# Patient Record
Sex: Female | Born: 1955 | Race: White | Hispanic: No | Marital: Married | Smoking: Never smoker
Health system: Southern US, Community
[De-identification: ages and names within clinical notes are randomized; demographics above are authoritative.]

## PROBLEM LIST (undated history)

## (undated) DIAGNOSIS — E785 Hyperlipidemia, unspecified: Secondary | ICD-10-CM

## (undated) DIAGNOSIS — I10 Essential (primary) hypertension: Secondary | ICD-10-CM

## (undated) HISTORY — DX: Essential (primary) hypertension: I10

## (undated) HISTORY — DX: Hyperlipidemia, unspecified: E78.5

---

## 2012-01-20 ENCOUNTER — Ambulatory Visit (INDEPENDENT_AMBULATORY_CARE_PROVIDER_SITE_OTHER): Payer: Self-pay | Admitting: Physician Assistant

## 2012-01-20 ENCOUNTER — Encounter: Payer: Self-pay | Admitting: Physician Assistant

## 2012-01-20 VITALS — BP 150/94 | HR 78 | Resp 14 | Ht 67.5 in | Wt 238.0 lb

## 2012-01-20 DIAGNOSIS — Z1211 Encounter for screening for malignant neoplasm of colon: Secondary | ICD-10-CM

## 2012-01-20 DIAGNOSIS — L409 Psoriasis, unspecified: Secondary | ICD-10-CM

## 2012-01-20 DIAGNOSIS — E785 Hyperlipidemia, unspecified: Secondary | ICD-10-CM

## 2012-01-20 DIAGNOSIS — I10 Essential (primary) hypertension: Secondary | ICD-10-CM | POA: Insufficient documentation

## 2012-01-20 DIAGNOSIS — Z1239 Encounter for other screening for malignant neoplasm of breast: Secondary | ICD-10-CM

## 2012-01-20 DIAGNOSIS — L408 Other psoriasis: Secondary | ICD-10-CM

## 2012-01-20 MED ORDER — METOPROLOL SUCCINATE ER 100 MG PO TB24: 100.0000 mg | ORAL_TABLET | Freq: Every day | ORAL | Status: DC

## 2012-01-20 MED ORDER — SIMVASTATIN 20 MG PO TABS: 20.0000 mg | ORAL_TABLET | Freq: Every evening | ORAL | Status: DC

## 2012-01-20 NOTE — Patient Instructions (Addendum)
Dr. Jearld Lesch for OBGYN.   Diet and exercise watch BP. Need BP under 140/90.  Will refer to colonoscopy and mammogram.

## 2012-01-20 NOTE — Progress Notes (Signed)
  Subjective:    Patient ID: Priscilla Sanders, female    DOB: 07-Aug-1955, 56 y.o.   MRN: [id number]  HPI Patient presents to the clinic to establish care and get med refills. PMH is positive for HTN and Hyperlipidemia for the past 5 years or more. Pt takes Toprol and zocor to manage this. She does not smoke. She tries to exercise but has not exercised much over the past 3 months because she has been busy. She admits to gaining about 30 pounds over the past 2-3 years. She does eat healthy.  She has no complaints or concerns today.   She does have ongoing psoriasis. She is trying to find out what a doctor prescribed 5 years a go that worked so well for her psoriasis.   Patient's BP was elevated today. Denies CP, palpitations, numbness and tingling, SOB or Headaches. She does not remember her BP being elevated in the past but does admit to high salt diet and weight gain.   She had a CPE with blood work this year.    Review of Systems     Objective:   Physical Exam  Constitutional: She is oriented to person, place, and time. She appears well-developed and well-nourished.       Overweight.  HENT:  Head: Normocephalic and atraumatic.  Cardiovascular: Normal rate, regular rhythm and normal heart sounds.   No murmur heard. Pulmonary/Chest: Effort normal and breath sounds normal.  Neurological: She is alert and oriented to person, place, and time.  Skin: Skin is warm and dry.  Psychiatric: She has a normal mood and affect. Her behavior is normal.          Assessment & Plan:  HTN- Toprol refilled. Discussed need to get BP under 140/90. Pt does not want to add another medication at this point. She wants to try diet, exercise and low salt. Want pt to monitor BP and call office if not decreasing with goal 120/80 and at least under 140/90. Follow up in 3 months and if BP not controlled then will need to add another agent. Discussed effects of HTN.   Hyperlipidemia- Refilled Zocor. Will recheck lipid  when I find out last check was.   Psoriasis Will wait and see if patient can find out the combination cream. If pt can call in and will rx.   Will refer for colonscopy and mammogram.  For OBGYN referred to Dr. Jearld Lesch. She requested a OB/GYN for pap smear needs.

## 2012-05-29 ENCOUNTER — Encounter: Payer: Self-pay | Admitting: Family Medicine

## 2012-05-29 ENCOUNTER — Ambulatory Visit (INDEPENDENT_AMBULATORY_CARE_PROVIDER_SITE_OTHER): Payer: BC Managed Care – PPO | Admitting: Family Medicine

## 2012-05-29 VITALS — BP 162/100 | HR 66 | Temp 97.8°F | Wt 234.0 lb

## 2012-05-29 DIAGNOSIS — I10 Essential (primary) hypertension: Secondary | ICD-10-CM

## 2012-05-29 DIAGNOSIS — J329 Chronic sinusitis, unspecified: Secondary | ICD-10-CM

## 2012-05-29 DIAGNOSIS — B9689 Other specified bacterial agents as the cause of diseases classified elsewhere: Secondary | ICD-10-CM

## 2012-05-29 DIAGNOSIS — L409 Psoriasis, unspecified: Secondary | ICD-10-CM

## 2012-05-29 DIAGNOSIS — A499 Bacterial infection, unspecified: Secondary | ICD-10-CM

## 2012-05-29 DIAGNOSIS — L408 Other psoriasis: Secondary | ICD-10-CM

## 2012-05-29 DIAGNOSIS — J45909 Unspecified asthma, uncomplicated: Secondary | ICD-10-CM

## 2012-05-29 MED ORDER — ALBUTEROL SULFATE HFA 108 (90 BASE) MCG/ACT IN AERS: 2.0000 | INHALATION_SPRAY | Freq: Four times a day (QID) | RESPIRATORY_TRACT | Status: DC | PRN

## 2012-05-29 MED ORDER — MOMETASONE FUROATE 50 MCG/ACT NA SUSP: NASAL | Status: DC

## 2012-05-29 MED ORDER — CLOBETASOL PROPIONATE 0.05 % EX OINT: TOPICAL_OINTMENT | Freq: Two times a day (BID) | CUTANEOUS | Status: DC

## 2012-05-29 MED ORDER — METOPROLOL SUCCINATE ER 200 MG PO TB24: 200.0000 mg | ORAL_TABLET | Freq: Every day | ORAL | Status: DC

## 2012-05-29 MED ORDER — AMOXICILLIN-POT CLAVULANATE 500-125 MG PO TABS: ORAL_TABLET | ORAL | Status: AC

## 2012-05-29 MED ORDER — BETAMETHASONE DIPROPIONATE 0.05 % EX CREA: TOPICAL_CREAM | Freq: Two times a day (BID) | CUTANEOUS | Status: DC

## 2012-05-29 NOTE — Progress Notes (Signed)
CC: Priscilla Sanders is a 57 y.o. female is here for Sore Throat and Shortness of Breath   Subjective: HPI:  Patient complains of one week of worsening nasal congestion with facial pressure below the eyes. Has been associated with a cough that is worse when lying down flat. Nasal discharge is worse when leaning forward and produces green thick discharge. Symptoms overall are moderate in severity. Seem to be getting worse on a daily basis but somewhat unchanged today compared to yesterday.  Interventions have included nasal saline washes. Complains of subjective fevers and chills for the past few days. Denies motor or sensory disturbances, headaches, bloody nose, nor dizziness. She has had some wheezing and cough since this all started.  Patient porches run out of topical steroids for psoriasis. She points out plaques on her elbows are worsening in size.  Reports taking metoprolol once a day. No recent missed doses. No outside blood pressures to report. Denies chest pain, headaches, orthopnea, peripheral edema, nor irregular heartbeat.     Review Of Systems Outlined In HPI  Past Medical History  Diagnosis Date  . Hyperlipidemia   . Hypertension      Family History  Problem Relation Age of Onset  . Hyperlipidemia Mother   . Hypertension Mother   . Cancer Mother     breast  . Hyperlipidemia Father   . Hypertension Father   . Cancer Brother      History  Substance Use Topics  . Smoking status: Never Smoker   . Smokeless tobacco: Not on file  . Alcohol Use: 0.0 oz/week     Objective: Filed Vitals:   05/29/12 1017  BP: 162/100  Pulse: 66  Temp: 97.8 F (36.6 C)    General: Alert and Oriented, No Acute Distress HEENT: Pupils equal, round, reactive to light. Conjunctivae clear.  External ears unremarkable, canals clear with intact TMs with appropriate landmarks.  Middle ear appears open without effusion. Pink inferior turbinates with moderate mucoid discharge.  Moist mucous  membranes, pharynx without inflammation nor lesions.  Neck supple without palpable lymphadenopathy nor abnormal masses. Lungs: Clear to auscultation bilaterally, no wheezing/ronchi/rales.  Comfortable work of breathing. Good air movement. Cardiac: Regular rate and rhythm. Normal S1/S2.  No murmurs, rubs, nor gallops.   Extremities: No peripheral edema.  Strong peripheral pulses.  Mental Status: No depression, anxiety, nor agitation. Skin: Warm and dry. Psoriatic plaques on the bilateral elbows approximately 7-8 cm in diameter  Assessment & Plan: Priscilla Sanders was seen today for sore throat and shortness of breath.  Diagnoses and associated orders for this visit:  Hypertension - metoprolol succinate (TOPROL-XL) 200 MG 24 hr tablet; Take 1 tablet (200 mg total) by mouth daily. Take with or immediately following a meal.  Psoriasis - clobetasol ointment (TEMOVATE) 0.05 %; Apply topically 2 (two) times daily. - betamethasone dipropionate (DIPROLENE) 0.05 % cream; Apply topically 2 (two) times daily.  Bacterial sinusitis - amoxicillin-clavulanate (AUGMENTIN) 500-125 MG per tablet; Take one by mouth every 8 hours for ten total days. - mometasone (NASONEX) 50 MCG/ACT nasal spray; Two sprays each nostril daily.  Reactive airway disease - Discontinue: albuterol (PROVENTIL HFA;VENTOLIN HFA) 108 (90 BASE) MCG/ACT inhaler; Inhale 2 puffs into the lungs every 6 (six) hours as needed for wheezing. - albuterol (PROVENTIL HFA;VENTOLIN HFA) 108 (90 BASE) MCG/ACT inhaler; Inhale 2 puffs into the lungs every 6 (six) hours as needed for wheezing.    Hypertension: Uncontrolled, increased to maximum dose metoprolol and return in 1-2 weeks for blood pressure  visit. Psoriasis: Uncontrolled and likely due to running out of medication therefore she was provided with refills Bacterial sinusitis with reactive airways disease probably exacerbated by postnasal drip: Start Augmentin and Nasonex for Nasonex to be taken to the  remainder of pollen season, refill of albuterol provided  Return in about 2 weeks (around 06/12/2012).

## 2012-07-30 ENCOUNTER — Other Ambulatory Visit: Payer: Self-pay | Admitting: Family Medicine

## 2012-07-30 ENCOUNTER — Other Ambulatory Visit: Payer: Self-pay | Admitting: Physician Assistant

## 2012-09-05 ENCOUNTER — Encounter: Payer: Self-pay | Admitting: Physician Assistant

## 2012-09-05 ENCOUNTER — Ambulatory Visit (INDEPENDENT_AMBULATORY_CARE_PROVIDER_SITE_OTHER): Payer: BC Managed Care – PPO | Admitting: Obstetrics & Gynecology

## 2012-09-05 ENCOUNTER — Encounter: Payer: Self-pay | Admitting: Obstetrics & Gynecology

## 2012-09-05 ENCOUNTER — Ambulatory Visit (INDEPENDENT_AMBULATORY_CARE_PROVIDER_SITE_OTHER): Payer: BC Managed Care – PPO | Admitting: Physician Assistant

## 2012-09-05 VITALS — BP 150/89 | HR 58 | Resp 16 | Ht 67.5 in | Wt 231.0 lb

## 2012-09-05 VITALS — BP 154/96 | HR 63 | Temp 98.2°F | Wt 232.0 lb

## 2012-09-05 DIAGNOSIS — N950 Postmenopausal bleeding: Secondary | ICD-10-CM | POA: Insufficient documentation

## 2012-09-05 DIAGNOSIS — Z1151 Encounter for screening for human papillomavirus (HPV): Secondary | ICD-10-CM

## 2012-09-05 DIAGNOSIS — Z01419 Encounter for gynecological examination (general) (routine) without abnormal findings: Secondary | ICD-10-CM

## 2012-09-05 DIAGNOSIS — I498 Other specified cardiac arrhythmias: Secondary | ICD-10-CM

## 2012-09-05 DIAGNOSIS — E669 Obesity, unspecified: Secondary | ICD-10-CM

## 2012-09-05 DIAGNOSIS — Z0000 Encounter for general adult medical examination without abnormal findings: Secondary | ICD-10-CM

## 2012-09-05 DIAGNOSIS — R5383 Other fatigue: Secondary | ICD-10-CM

## 2012-09-05 DIAGNOSIS — Z79899 Other long term (current) drug therapy: Secondary | ICD-10-CM

## 2012-09-05 DIAGNOSIS — I10 Essential (primary) hypertension: Secondary | ICD-10-CM

## 2012-09-05 DIAGNOSIS — R4586 Emotional lability: Secondary | ICD-10-CM

## 2012-09-05 DIAGNOSIS — Z124 Encounter for screening for malignant neoplasm of cervix: Secondary | ICD-10-CM

## 2012-09-05 DIAGNOSIS — R001 Bradycardia, unspecified: Secondary | ICD-10-CM

## 2012-09-05 DIAGNOSIS — F39 Unspecified mood [affective] disorder: Secondary | ICD-10-CM

## 2012-09-05 LAB — FOLLICLE STIMULATING HORMONE: FSH: 56.3 m[IU]/mL

## 2012-09-05 LAB — TSH: TSH: 2.525 u[IU]/mL (ref 0.350–4.500)

## 2012-09-05 MED ORDER — LISINOPRIL-HYDROCHLOROTHIAZIDE 20-12.5 MG PO TABS: 1.0000 | ORAL_TABLET | Freq: Every day | ORAL | Status: DC

## 2012-09-05 MED ORDER — METOPROLOL SUCCINATE ER 100 MG PO TB24: 100.0000 mg | ORAL_TABLET | Freq: Every day | ORAL | Status: DC

## 2012-09-05 MED ORDER — MISOPROSTOL 200 MCG PO TABS: ORAL_TABLET | ORAL | Status: DC

## 2012-09-05 NOTE — Patient Instructions (Signed)
Start Lisinopril/HCTZ daily.  Decrease Toprol to 100mg  daily.  Follow up in 1 week.

## 2012-09-05 NOTE — Progress Notes (Signed)
  Subjective:    Patient ID: Priscilla Sanders, female    DOB: 03/01/1955, 57 y.o.   MRN: [id number]  HPI Patient is a 57 year old female who presents to the clinic with low pulse and high BP. Patient was just seen today in gynecologist office. She has experienced some postmenopausal bleeding and they have scheduled a biopsy. FSH and TSH levels were checked today by gynecologist office. They were alerted because her pulse was in the low 50s at OBs office. Patient admits to have feeling bad for the last 2 weeks. She has experience some feelings of like a ongoing low-grade fever. She has been very sensitive to emotions. She has been slightly bleeding for the last 3 weeks. She denies any fever, shortness of breath, sinus pressure, ear pain, wheezing or any other feelings of illness. She is concerned because she is supposed to go to [employer] in one week with her grandson. She denies any chest pains, palpitations, vision changes, headaches. Her Toprol was increased by Dr. Ivan Anchors to 200 mg at last visit. She admits to never take anything but Toprol for blood pressure problem.   Review of Systems     Objective:   Physical Exam  Constitutional: She is oriented to person, place, and time. She appears well-developed and well-nourished.  HENT:  Head: Normocephalic and atraumatic.  Right Ear: External ear normal.  Left Ear: External ear normal.  Nose: Nose normal.  Mouth/Throat: Oropharynx is clear and moist.  Eyes: Conjunctivae are normal. Right eye exhibits no discharge. Left eye exhibits no discharge.  Neck: Normal range of motion. Neck supple.  Cardiovascular: Regular rhythm and normal heart sounds.   Bradycardia when I checked at 60.  Pulmonary/Chest: Effort normal and breath sounds normal. She has no wheezes.  Lymphadenopathy:    She has no cervical adenopathy.  Neurological: She is alert and oriented to person, place, and time.  Skin: Skin is warm and dry.  Psychiatric: She has a normal mood and  affect. Her behavior is normal.          Assessment & Plan:  Hypertension/bradycardia-discuss with patient that increase of Toprol may cause her pulse to drop too low. Will decrease Toprol back down to 100 mg daily. Also discussed that did not seem to help with blood pressure anyway. Will add lisinopril/HCTZ 2 blood pressure regimen in hopes of decreasing blood pressure and increasing pulse. Side effects of cough and insertion to kidney were discussed. Do not have a recent CMP added to labs done by Dr. Gala Romney this morning. Will recheck in 1 week before patient goes on trips.  Mood changes/hot flashes/fatigue- reassured patient that beta blocker could have been causing her to feel more fatigued and weak. It should not be affecting her mood or hot flashes. She did not have a temperature today. There could be some hormonal changes going on from postmenopausal bleeding. I like to give this time and wants we get results back from biopsy and hormone levels we can make a better call as to why patients on this weight.

## 2012-09-05 NOTE — Progress Notes (Signed)
Subjective:    Priscilla Sanders is a 57 y.o. female who presents for an annual exam. She needs an annual exam and also complains of spotting for about 3-4 weeks after having been menopausal for about a year. The patient is not currently sexually active (husband s/p prostate cancer and has ED)  GYN screening history: last pap: was normal about 2009. The patient wears seatbelts: yes. The patient participates in regular exercise: [place] and works outside. Has the patient ever been transfused or tattooed?: no. The patient reports that there is not domestic violence in her life.   Menstrual History: OB History   Grav Para Term Preterm Abortions TAB SAB Ect Mult Living                  Menarche age: 82 [place]: 64   No LMP recorded. Patient is postmenopausal.    The following portions of the patient's history were reviewed and updated as appropriate: allergies, current medications, past family history, past medical history, past social history, past surgical history and problem list.  Review of Systems A comprehensive review of systems was negative. 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 Married for 30 years, mammogram last done in 2009 with pap, last fasting labs utd   Objective:    BP 150/89  Pulse 58  Resp 16  Ht 5' 7.5" (1.715 m)  Wt 231 lb (104.781 kg)  BMI 35.62 kg/m2  General Appearance:    Alert, cooperative, no distress, appears stated age  Head:    Normocephalic, without obvious abnormality, atraumatic  Eyes:    PERRL, conjunctiva/corneas clear, EOM's intact, fundi    benign, both eyes  Ears:    Normal TM's and external ear canals, both ears  Nose:   Nares normal, septum midline, mucosa normal, no drainage    or sinus tenderness  Throat:   Lips, mucosa, and tongue normal; teeth and gums normal  Neck:   Supple, symmetrical, trachea midline, no adenopathy;    thyroid:  no enlargement/tenderness/nodules; no carotid   bruit or JVD  Back:     Symmetric, no curvature, ROM  normal, no CVA tenderness  Lungs:     Clear to auscultation bilaterally, respirations unlabored  Chest Wall:    No tenderness or deformity   Heart:    Regular rate and rhythm, S1 and S2 normal, no murmur, rub   or gallop  Breast Exam:    No tenderness, masses, or nipple abnormality  Abdomen:     Soft, non-tender, bowel sounds active all four quadrants,    no masses, no organomegaly  Genitalia:    Vulva with a discrete parallel redness pattern (She denies GSUI), cervix with small amount of red blood from os, NSSA, NT, normal adnexal exam     Extremities:   Extremities normal, atraumatic, no cyanosis or edema  Pulses:   2+ and symmetric all extremities  Skin:   Skin color, texture, turgor normal, no rashes or lesions  Lymph nodes:   Cervical, supraclavicular, and axillary nodes normal  Neurologic:   CNII-XII intact, normal strength, sensation and reflexes    throughout  .    Assessment:    Healthy female exam.  Vulvar abnormality PMB   Plan:     Thin prep Pap smear. with cotesting Schedule mammogram Vulvar biopsy and EMBX at next visit. Pretreat with po cytotec. Gyn u/s, TSH (pulse 50 but also on a betablocker for HTN), FSH

## 2012-09-06 ENCOUNTER — Ambulatory Visit (INDEPENDENT_AMBULATORY_CARE_PROVIDER_SITE_OTHER): Payer: BC Managed Care – PPO

## 2012-09-06 ENCOUNTER — Telehealth: Payer: Self-pay | Admitting: *Deleted

## 2012-09-06 DIAGNOSIS — Z1231 Encounter for screening mammogram for malignant neoplasm of breast: Secondary | ICD-10-CM

## 2012-09-06 DIAGNOSIS — N950 Postmenopausal bleeding: Secondary | ICD-10-CM

## 2012-09-06 DIAGNOSIS — R9389 Abnormal findings on diagnostic imaging of other specified body structures: Secondary | ICD-10-CM

## 2012-09-06 DIAGNOSIS — Z0000 Encounter for general adult medical examination without abnormal findings: Secondary | ICD-10-CM

## 2012-09-06 LAB — COMPLETE METABOLIC PANEL WITH GFR
ALT: 22 U/L (ref 0–35)
AST: 18 U/L (ref 0–37)
Alkaline Phosphatase: 78 U/L (ref 39–117)
Creat: 0.96 mg/dL (ref 0.50–1.10)
Sodium: 141 mEq/L (ref 135–145)
Total Bilirubin: 0.6 mg/dL (ref 0.3–1.2)
Total Protein: 7 g/dL (ref 6.0–8.3)

## 2012-09-06 NOTE — Telephone Encounter (Signed)
Copy of labs mailed to pt's home address. 

## 2012-09-19 ENCOUNTER — Ambulatory Visit (INDEPENDENT_AMBULATORY_CARE_PROVIDER_SITE_OTHER): Payer: BC Managed Care – PPO | Admitting: Obstetrics & Gynecology

## 2012-09-19 ENCOUNTER — Encounter: Payer: Self-pay | Admitting: Obstetrics & Gynecology

## 2012-09-19 VITALS — BP 142/98 | HR 74 | Resp 16 | Ht 67.0 in | Wt 230.0 lb

## 2012-09-19 DIAGNOSIS — N909 Noninflammatory disorder of vulva and perineum, unspecified: Secondary | ICD-10-CM

## 2012-09-19 DIAGNOSIS — N904 Leukoplakia of vulva: Secondary | ICD-10-CM

## 2012-09-19 DIAGNOSIS — N950 Postmenopausal bleeding: Secondary | ICD-10-CM

## 2012-09-19 DIAGNOSIS — Z01812 Encounter for preprocedural laboratory examination: Secondary | ICD-10-CM

## 2012-09-19 NOTE — Progress Notes (Addendum)
  Subjective:    Patient ID: Priscilla Sanders, female    DOB: 1955/08/30, 57 y.o.   MRN: [id number]  HPI  She is here today for her vulvar biopsy and EMBX (pmb). She took cytotec last night and has had some cramping and diarrhea.  Review of Systems Mammogram, pap smear, and TSH wnl     Objective:   Physical Exam   UPT negative, consent signed, time out done Cervix prepped with betadine and grasped with a single tooth tenaculum Uterus sounded to 7 cm Pipelle used for 2 passes with a small amount of tissue obtained. She tolerated the procedure well.  Her vulva was prepped on the right labium majus with betadine and infiltrated with 1 cc of 1% lidocaine. I used a 3 mm punch biopsy to remove a small amount of the reddened vulvar skin. Hemostasis was achieved with silver nitrate. She tolerated the procedure well.     Assessment & Plan:  PMB- await EMBX Vulvar redness- ?relation to her psoriasis. Await biopsy. If her PMB continues after 1 month, then she will call and I will schedule a hysteroscopy and dilation and curettage.

## 2012-09-24 ENCOUNTER — Telehealth: Payer: Self-pay | Admitting: *Deleted

## 2012-09-25 NOTE — Telephone Encounter (Signed)
Pt notified of recent biopsies were neg.

## 2012-10-28 ENCOUNTER — Other Ambulatory Visit: Payer: Self-pay | Admitting: Physician Assistant

## 2012-11-02 ENCOUNTER — Other Ambulatory Visit: Payer: Self-pay | Admitting: Physician Assistant

## 2012-12-31 ENCOUNTER — Other Ambulatory Visit: Payer: Self-pay | Admitting: Physician Assistant

## 2013-03-02 ENCOUNTER — Other Ambulatory Visit: Payer: Self-pay | Admitting: Physician Assistant

## 2013-03-04 ENCOUNTER — Ambulatory Visit (INDEPENDENT_AMBULATORY_CARE_PROVIDER_SITE_OTHER): Payer: BC Managed Care – PPO | Admitting: Physician Assistant

## 2013-03-04 ENCOUNTER — Encounter: Payer: Self-pay | Admitting: Physician Assistant

## 2013-03-04 VITALS — BP 134/78 | HR 59 | Wt 227.0 lb

## 2013-03-04 DIAGNOSIS — R82998 Other abnormal findings in urine: Secondary | ICD-10-CM

## 2013-03-04 DIAGNOSIS — R197 Diarrhea, unspecified: Secondary | ICD-10-CM

## 2013-03-04 DIAGNOSIS — R109 Unspecified abdominal pain: Secondary | ICD-10-CM

## 2013-03-04 LAB — POCT URINALYSIS DIPSTICK
Bilirubin, UA: NEGATIVE
Blood, UA: NEGATIVE
Glucose, UA: NEGATIVE
KETONES UA: NEGATIVE
Nitrite, UA: NEGATIVE
Protein, UA: NEGATIVE
SPEC GRAV UA: 1.025
Urobilinogen, UA: 0.2
pH, UA: 5.5

## 2013-03-04 MED ORDER — DICYCLOMINE HCL 10 MG PO CAPS: 10.0000 mg | ORAL_CAPSULE | Freq: Three times a day (TID) | ORAL | Status: DC

## 2013-03-04 NOTE — Progress Notes (Signed)
   Subjective:    Patient ID: Priscilla Sanders, female    DOB: 10/14/1955, 58 y.o.   MRN: [id number]  HPI Patient presents to the clinic with ongoing diarrhea and recent bilateral flank pain. Diarrhea started 6 days ago. She had a few chills and felt horrible. She continues to have 5-10 stools a day that are yellow in color. She does not have loose stools as long as she does not eat. Once she eats she goes to the bathroom constantly for about 3 hours and then gets better. She feels clamy sometimes. Denies any nausea, vomiting. No pain with urination. Denies any dark stools or bright red blood. No new medications. No raw food. No travel. Last Abx was zpak at christmas. No one else is sick. Bilateral flank pain started this morning with 8/10 pain that brought her to tears she is better now. But very concerning.       Review of Systems     Objective:   Physical Exam  Constitutional: She is oriented to person, place, and time. She appears well-developed and well-nourished.  HENT:  Head: Normocephalic and atraumatic.  Cardiovascular: Normal rate, regular rhythm and normal heart sounds.   Pulmonary/Chest: Effort normal and breath sounds normal.  No cva tenderness.   Abdominal: Soft.  Hyperactive bowel sounds.  Slight discomfort in all 4 quadrants to palpation. No guarding, rebound or masses. Negative murphys sign.   Neurological: She is oriented to person, place, and time.  Skin: Skin is dry.  Psychiatric: She has a normal mood and affect. Her behavior is normal.          Assessment & Plan:  1. Bilateral flank pain Unclear etiology but I suspect having spams with diarrhea. Will get culture. For cramping gave bentyl up to 4 times a day.  - POCT urinalysis dipstick - Urine Culture  2. Diarrhea Awaiting results. I do not want to start anti-diarrhea until confirm no bacteria. Gave copy of brat diet and discussed viral infections can last 8-10 days on average. Stay hydrated and push fluids.  Gave signs of dehydration.  - Stool Culture - Stool C-Diff Toxin Assay  3. Leukocytes in urine No symptoms of UTI and no blood in urine to suggest kidney stone. Will get culture and continue to monitor. Likely normal variant.  - Urine Culture

## 2013-03-04 NOTE — Patient Instructions (Signed)
Viral Gastroenteritis  Viral gastroenteritis is also known as stomach flu. This condition affects the stomach and intestinal tract. It can cause sudden diarrhea and vomiting. The illness typically lasts 3 to 8 days. Most people develop an immune response that eventually gets rid of the virus. While this natural response develops, the virus can make you quite ill.  CAUSES   Many different viruses can cause gastroenteritis, such as rotavirus or noroviruses. You can catch one of these viruses by consuming contaminated food or water. You may also catch a virus by sharing utensils or other personal items with an infected person or by touching a contaminated surface.  SYMPTOMS   The most common symptoms are diarrhea and vomiting. These problems can cause a severe loss of body fluids (dehydration) and a body salt (electrolyte) imbalance. Other symptoms may include:  · Fever.  · Headache.  · Fatigue.  · Abdominal pain.  DIAGNOSIS   Your caregiver can usually diagnose viral gastroenteritis based on your symptoms and a physical exam. A stool sample may also be taken to test for the presence of viruses or other infections.  TREATMENT   This illness typically goes away on its own. Treatments are aimed at rehydration. The most serious cases of viral gastroenteritis involve vomiting so severely that you are not able to keep fluids down. In these cases, fluids must be given through an intravenous line (IV).  HOME CARE INSTRUCTIONS   · Drink enough fluids to keep your urine clear or pale yellow. Drink small amounts of fluids frequently and increase the amounts as tolerated.  · Ask your caregiver for specific rehydration instructions.  · Avoid:  · Foods high in sugar.  · Alcohol.  · Carbonated drinks.  · Tobacco.  · Juice.  · Caffeine drinks.  · Extremely hot or cold fluids.  · Fatty, greasy foods.  · Too much intake of anything at one time.  · Dairy products until 24 to 48 hours after diarrhea stops.  · You may consume probiotics.  Probiotics are active cultures of beneficial bacteria. They may lessen the amount and number of diarrheal stools in adults. Probiotics can be found in yogurt with active cultures and in supplements.  · Wash your hands well to avoid spreading the virus.  · Only take over-the-counter or prescription medicines for pain, discomfort, or fever as directed by your caregiver. Do not give aspirin to children. Antidiarrheal medicines are not recommended.  · Ask your caregiver if you should continue to take your regular prescribed and over-the-counter medicines.  · Keep all follow-up appointments as directed by your caregiver.  SEEK IMMEDIATE MEDICAL CARE IF:   · You are unable to keep fluids down.  · You do not urinate at least once every 6 to 8 hours.  · You develop shortness of breath.  · You notice blood in your stool or vomit. This may look like coffee grounds.  · You have abdominal pain that increases or is concentrated in one small area (localized).  · You have persistent vomiting or diarrhea.  · You have a fever.  · The patient is a child younger than 3 months, and he or she has a fever.  · The patient is a child older than 3 months, and he or she has a fever and persistent symptoms.  · The patient is a child older than 3 months, and he or she has a fever and symptoms suddenly get worse.  · The patient is a baby, and he   or she has no tears when crying.  MAKE SURE YOU:   · Understand these instructions.  · Will watch your condition.  · Will get help right away if you are not doing well or get worse.  Document Released: 01/17/2005 Document Revised: 04/11/2011 Document Reviewed: 11/03/2010  ExitCare® Patient Information ©2014 [employer].  Diet for Diarrhea, Adult  Frequent, runny stools (diarrhea) may be caused or worsened by food or drink. Diarrhea may be relieved by changing your diet. Since diarrhea can last up to 7 days, it is easy for you to lose too much fluid from the body and become dehydrated. Fluids that are  lost need to be replaced. Along with a modified diet, make sure you drink enough fluids to keep your urine clear or pale yellow.  DIET INSTRUCTIONS  · Ensure adequate fluid intake (hydration): have 1 cup (8 oz) of fluid for each diarrhea episode. Avoid fluids that contain simple sugars or sports drinks, fruit juices, whole milk products, and sodas. Your urine should be clear or pale yellow if you are drinking enough fluids. Hydrate with an oral rehydration solution that you can purchase at pharmacies, retail stores, and online. You can prepare an oral rehydration solution at home by mixing the following ingredients together:  ·   tsp table salt.  · ¾ tsp baking soda.  ·  tsp salt substitute containing potassium chloride.  · 1  tablespoons sugar.  · 1 L (34 oz) of water.  · Certain foods and beverages may increase the speed at which food moves through the gastrointestinal (GI) tract. These foods and beverages should be avoided and include:  · Caffeinated and alcoholic beverages.  · High-fiber foods, such as raw fruits and vegetables, nuts, seeds, and whole grain breads and cereals.  · Foods and beverages sweetened with sugar alcohols, such as xylitol, sorbitol, and mannitol.  · Some foods may be well tolerated and may help thicken stool including:  · Starchy foods, such as rice, toast, pasta, low-sugar cereal, oatmeal, grits, baked potatoes, crackers, and bagels.    · Bananas.    · Applesauce.  · Add probiotic-rich foods to help increase healthy bacteria in the GI tract, such as yogurt and fermented milk products.  RECOMMENDED FOODS AND BEVERAGES  Starches  Choose foods with less than 2 g of fiber per serving.  · Recommended:  White, French, and pita breads, plain rolls, buns, bagels. Plain muffins, matzo. Soda, saltine, or graham crackers. Pretzels, melba toast, zwieback. Cooked cereals made with water: cornmeal, farina, cream cereals. Dry cereals: refined corn, wheat, rice. Potatoes prepared any way without skins,  refined macaroni, spaghetti, noodles, refined rice.  · Avoid:  Bread, rolls, or crackers made with whole wheat, multi-grains, rye, bran seeds, nuts, or coconut. Corn tortillas or taco shells. Cereals containing whole grains, multi-grains, bran, coconut, nuts, raisins. Cooked or dry oatmeal. Coarse wheat cereals, granola. Cereals advertised as "high-fiber." Potato skins. Whole grain pasta, wild or brown rice. Popcorn. Sweet potatoes, yams. Sweet rolls, doughnuts, waffles, pancakes, sweet breads.  Vegetables  · Recommended: Strained tomato and vegetable juices. Most well-cooked and canned vegetables without seeds. Fresh: Tender lettuce, cucumber without the skin, cabbage, spinach, bean sprouts.  · Avoid: Fresh, cooked, or canned: Artichokes, baked beans, beet greens, broccoli, Brussels sprouts, corn, kale, legumes, peas, sweet potatoes. Cooked: Green or red cabbage, spinach. Avoid large servings of any vegetables because vegetables shrink when cooked, and they contain more fiber per serving than fresh vegetables.  Fruit  · Recommended: Cooked   or canned: Apricots, applesauce, cantaloupe, cherries, fruit cocktail, grapefruit, grapes, kiwi, mandarin oranges, peaches, pears, plums, watermelon. Fresh: Apples without skin, ripe banana, grapes, cantaloupe, cherries, grapefruit, peaches, oranges, plums. Keep servings limited to ½ cup or 1 piece.  · Avoid: Fresh: Apples with skin, apricots, mangoes, pears, raspberries, strawberries. Prune juice, stewed or dried prunes. Dried fruits, raisins, dates. Large servings of all fresh fruits.  Protein  · Recommended: Ground or well-cooked tender beef, ham, veal, lamb, pork, or poultry. Eggs. Fish, oysters, shrimp, lobster, other seafoods. Liver, organ meats.  · Avoid: Tough, fibrous meats with gristle. Peanut butter, smooth or chunky. Cheese, nuts, seeds, legumes, dried peas, beans, lentils.  Dairy  · Recommended: Yogurt, lactose-free milk, kefir, drinkable yogurt, buttermilk, soy  milk, or plain hard cheese.  · Avoid: Milk, chocolate milk, beverages made with milk, such as milkshakes.  Soups  · Recommended: Bouillon, broth, or soups made from allowed foods. Any strained soup.  · Avoid: Soups made from vegetables that are not allowed, cream or milk-based soups.  Desserts and Sweets  · Recommended: Sugar-free gelatin, sugar-free frozen ice pops made without sugar alcohol.  · Avoid: Plain cakes and cookies, pie made with fruit, pudding, custard, cream pie. Gelatin, fruit, ice, sherbet, frozen ice pops. Ice cream, ice milk without nuts. Plain hard candy, honey, jelly, molasses, syrup, sugar, chocolate syrup, gumdrops, marshmallows.  Fats and Oils  · Recommended: Limit fats to less than 8 tsp per day.  · Avoid: Seeds, nuts, olives, avocados. Margarine, butter, cream, mayonnaise, salad oils, plain salad dressings. Plain gravy, crisp bacon without rind.  Beverages  · Recommended: Water, decaffeinated teas, oral rehydration solutions, sugar-free beverages not sweetened with sugar alcohols.  · Avoid: Fruit juices, caffeinated beverages (coffee, tea, soda), alcohol, sports drinks, or lemon-lime soda.  Condiments  · Recommended: Ketchup, mustard, horseradish, vinegar, cocoa powder. Spices in moderation: allspice, basil, bay leaves, celery powder or leaves, cinnamon, cumin powder, curry powder, ginger, mace, marjoram, onion or garlic powder, oregano, paprika, parsley flakes, ground pepper, rosemary, sage, savory, tarragon, thyme, turmeric.  · Avoid: Coconut, honey.  Document Released: 04/09/2003 Document Revised: 10/12/2011 Document Reviewed: 06/03/2011  ExitCare® Patient Information ©2014 [employer].

## 2013-03-05 ENCOUNTER — Other Ambulatory Visit: Payer: Self-pay | Admitting: Physician Assistant

## 2013-03-05 LAB — URINE CULTURE
Colony Count: NO GROWTH
ORGANISM ID, BACTERIA: NO GROWTH

## 2013-03-05 LAB — CLOSTRIDIUM DIFFICILE EIA: CDIFTX: NEGATIVE

## 2013-03-08 LAB — STOOL CULTURE

## 2013-05-04 ENCOUNTER — Other Ambulatory Visit: Payer: Self-pay | Admitting: Physician Assistant

## 2013-06-07 ENCOUNTER — Other Ambulatory Visit: Payer: Self-pay | Admitting: Physician Assistant

## 2013-06-29 ENCOUNTER — Other Ambulatory Visit: Payer: Self-pay | Admitting: Physician Assistant

## 2013-09-01 ENCOUNTER — Other Ambulatory Visit: Payer: Self-pay | Admitting: Physician Assistant

## 2013-10-13 ENCOUNTER — Other Ambulatory Visit: Payer: Self-pay | Admitting: Physician Assistant

## 2013-11-04 ENCOUNTER — Encounter: Payer: Self-pay | Admitting: Physician Assistant

## 2013-11-04 ENCOUNTER — Ambulatory Visit (INDEPENDENT_AMBULATORY_CARE_PROVIDER_SITE_OTHER): Payer: BC Managed Care – PPO | Admitting: Physician Assistant

## 2013-11-04 VITALS — BP 156/84 | HR 84 | Wt 238.0 lb

## 2013-11-04 DIAGNOSIS — R1115 Cyclical vomiting syndrome unrelated to migraine: Secondary | ICD-10-CM

## 2013-11-04 DIAGNOSIS — R1011 Right upper quadrant pain: Secondary | ICD-10-CM | POA: Diagnosis not present

## 2013-11-04 DIAGNOSIS — R142 Eructation: Secondary | ICD-10-CM | POA: Diagnosis not present

## 2013-11-04 DIAGNOSIS — G43A0 Cyclical vomiting, not intractable: Secondary | ICD-10-CM | POA: Diagnosis not present

## 2013-11-04 DIAGNOSIS — I10 Essential (primary) hypertension: Secondary | ICD-10-CM

## 2013-11-04 NOTE — Progress Notes (Signed)
   Subjective:    Patient ID: Priscilla Sanders, female    DOB: 01/15/1956, 58 y.o.   MRN: [id number]  HPI Pt presents to the clinic with RUQ abdominal pain that radiates into her right shoulder and sometimes into her back for last 3 weeks off and on. No known trigger. Eating does not seem to make better or worse. Denies any acid reflux. Saturday night she had an awful episode where she also vomited multiple times and had a pounding headache. She has noticed that she is more burpy. Better today. Nothing that she seems to do makes better.    HTN- not taking medication as directed. Denies any CP, palpations.    Review of Systems  All other systems reviewed and are negative.      Objective:   Physical Exam  Constitutional: She is oriented to person, place, and time. She appears well-developed and well-nourished.  HENT:  Head: Normocephalic and atraumatic.  Cardiovascular: Normal rate, regular rhythm and normal heart sounds.   Pulmonary/Chest: Effort normal and breath sounds normal. She has no wheezes.  Abdominal: Soft. Bowel sounds are normal. She exhibits no distension and no mass. There is no tenderness. There is no rebound and no guarding.  Neurological: She is alert and oriented to person, place, and time.  Skin: Skin is dry.  Psychiatric: She has a normal mood and affect. Her behavior is normal.          Assessment & Plan:  RUQ pain/nausea/vomiting- will get cbc, lipase, cmp. Will get RUQ ultrasound first thing in the morning. If normal will consider HIDA scan.    HTN- discussed need to start back on lisinopril/HCTZ and metoprolol. Refilled for 6 months.

## 2013-11-05 ENCOUNTER — Encounter: Payer: Self-pay | Admitting: Physician Assistant

## 2013-11-05 ENCOUNTER — Ambulatory Visit (INDEPENDENT_AMBULATORY_CARE_PROVIDER_SITE_OTHER): Payer: BC Managed Care – PPO

## 2013-11-05 DIAGNOSIS — R1011 Right upper quadrant pain: Secondary | ICD-10-CM

## 2013-11-05 DIAGNOSIS — K760 Fatty (change of) liver, not elsewhere classified: Secondary | ICD-10-CM

## 2013-11-05 DIAGNOSIS — R1115 Cyclical vomiting syndrome unrelated to migraine: Secondary | ICD-10-CM

## 2013-11-05 DIAGNOSIS — R142 Eructation: Secondary | ICD-10-CM

## 2013-11-05 DIAGNOSIS — R7989 Other specified abnormal findings of blood chemistry: Secondary | ICD-10-CM | POA: Insufficient documentation

## 2013-11-05 LAB — CBC WITH DIFFERENTIAL/PLATELET
BASOS ABS: 0 10*3/uL (ref 0.0–0.1)
BASOS PCT: 0 % (ref 0–1)
EOS PCT: 2 % (ref 0–5)
Eosinophils Absolute: 0.2 10*3/uL (ref 0.0–0.7)
HCT: 40.9 % (ref 36.0–46.0)
Hemoglobin: 13.7 g/dL (ref 12.0–15.0)
Lymphocytes Relative: 47 % — ABNORMAL HIGH (ref 12–46)
Lymphs Abs: 3.6 10*3/uL (ref 0.7–4.0)
MCH: 30 pg (ref 26.0–34.0)
MCHC: 33.5 g/dL (ref 30.0–36.0)
MCV: 89.5 fL (ref 78.0–100.0)
MONO ABS: 0.5 10*3/uL (ref 0.1–1.0)
Monocytes Relative: 7 % (ref 3–12)
NEUTROS ABS: 3.3 10*3/uL (ref 1.7–7.7)
Neutrophils Relative %: 44 % (ref 43–77)
PLATELETS: 195 10*3/uL (ref 150–400)
RBC: 4.57 MIL/uL (ref 3.87–5.11)
RDW: 14.3 % (ref 11.5–15.5)
WBC: 7.6 10*3/uL (ref 4.0–10.5)

## 2013-11-05 LAB — COMPLETE METABOLIC PANEL WITH GFR
ALT: 40 U/L — ABNORMAL HIGH (ref 0–35)
AST: 28 U/L (ref 0–37)
Albumin: 4.2 g/dL (ref 3.5–5.2)
Alkaline Phosphatase: 68 U/L (ref 39–117)
BUN: 18 mg/dL (ref 6–23)
CALCIUM: 9.2 mg/dL (ref 8.4–10.5)
CHLORIDE: 106 meq/L (ref 96–112)
CO2: 25 meq/L (ref 19–32)
CREATININE: 1.16 mg/dL — AB (ref 0.50–1.10)
GFR, EST AFRICAN AMERICAN: 61 mL/min
GFR, Est Non African American: 53 mL/min — ABNORMAL LOW
Glucose, Bld: 89 mg/dL (ref 70–99)
Potassium: 4.2 mEq/L (ref 3.5–5.3)
Sodium: 142 mEq/L (ref 135–145)
Total Bilirubin: 0.4 mg/dL (ref 0.2–1.2)
Total Protein: 6.6 g/dL (ref 6.0–8.3)

## 2013-11-05 LAB — LIPASE: LIPASE: 18 U/L (ref 0–75)

## 2013-11-05 MED ORDER — METOPROLOL SUCCINATE ER 100 MG PO TB24: ORAL_TABLET | ORAL | Status: DC

## 2013-11-05 MED ORDER — LISINOPRIL-HYDROCHLOROTHIAZIDE 20-12.5 MG PO TABS: ORAL_TABLET | ORAL | Status: DC

## 2013-11-11 ENCOUNTER — Other Ambulatory Visit: Payer: Self-pay | Admitting: Physician Assistant

## 2013-12-14 ENCOUNTER — Other Ambulatory Visit: Payer: Self-pay | Admitting: Physician Assistant

## 2014-02-28 ENCOUNTER — Other Ambulatory Visit: Payer: Self-pay | Admitting: Family Medicine

## 2014-02-28 NOTE — Telephone Encounter (Signed)
Appointment needed before future refills 

## 2014-05-20 ENCOUNTER — Other Ambulatory Visit: Payer: Self-pay | Admitting: Physician Assistant

## 2014-05-20 NOTE — Telephone Encounter (Signed)
Left message with Pt advising it is time to schedule f/u appt. Callback information provided.

## 2014-08-09 ENCOUNTER — Other Ambulatory Visit: Payer: Self-pay | Admitting: Physician Assistant

## 2014-08-13 ENCOUNTER — Ambulatory Visit (INDEPENDENT_AMBULATORY_CARE_PROVIDER_SITE_OTHER): Payer: BLUE CROSS/BLUE SHIELD | Admitting: Family Medicine

## 2014-08-13 VITALS — BP 147/93 | HR 75 | Wt 241.0 lb

## 2014-08-13 DIAGNOSIS — I10 Essential (primary) hypertension: Secondary | ICD-10-CM

## 2014-08-13 DIAGNOSIS — Z7189 Other specified counseling: Secondary | ICD-10-CM

## 2014-08-13 DIAGNOSIS — R05 Cough: Secondary | ICD-10-CM

## 2014-08-13 DIAGNOSIS — R059 Cough, unspecified: Secondary | ICD-10-CM | POA: Insufficient documentation

## 2014-08-13 DIAGNOSIS — Z7184 Encounter for health counseling related to travel: Secondary | ICD-10-CM | POA: Insufficient documentation

## 2014-08-13 DIAGNOSIS — Z23 Encounter for immunization: Secondary | ICD-10-CM

## 2014-08-13 MED ORDER — ATOVAQUONE-PROGUANIL HCL 250-100 MG PO TABS: 1.0000 | ORAL_TABLET | Freq: Every day | ORAL | Status: DC

## 2014-08-13 MED ORDER — IPRATROPIUM BROMIDE 0.06 % NA SOLN: 2.0000 | Freq: Four times a day (QID) | NASAL | Status: AC

## 2014-08-13 MED ORDER — OMEPRAZOLE 40 MG PO CPDR: 40.0000 mg | DELAYED_RELEASE_CAPSULE | Freq: Every day | ORAL | Status: AC

## 2014-08-13 NOTE — Assessment & Plan Note (Addendum)
Well-controlled. Recheck in the near future. Continue labs.

## 2014-08-13 NOTE — Patient Instructions (Addendum)
Thank you for coming in today. Start taking the malaria medicine one or 2 days before you leave and for one week after you get back Use the Atrovent nasal spray and omeprazole for cough Call or go to the emergency room if you get worse, have trouble breathing, have chest pains, or palpitations.   Malaria Malaria is an illness caused by a parasite, usually an infected mosquito. A parasite is an organism that lives in another host (such as a human) and gets nourishment at the host's expense. CAUSES  Malaria is spread from person to person by anopheline mosquitos. Areas where malaria occurs are tropical regions of:  Sub[place]Saharan [place].  [place].  [place] ([place] near[place]by [place]  [place]. SYMPTOMS  Malaria may cause very mild symptoms, severe disease and even death.  Following the bite of an infected mosquito, a period of time goes by (incubation period) before the first symptoms appear. The incubation period is typically 8 to 25 days. Patients with uncomplicated malaria typically have a fever of unknown cause. Other common symptoms include:  Headache.  Weakness.  Night sweats.  Muscle and joint pains. Fever may come and go, recurring every 2 to 3 days. As a general rule, all travelers that get a fever and who have visited a place where malaria is common, should be considered to have malaria until or unless the diagnosis is disproved. Severe disease typically occurs in a person with no prior malaria illness and an infection with only one of the four different malaria species (plasmodium falciparum). Symptoms may include:  Drowsiness.  Seizures.  Shortness of breath.  Severe weakness.  Loss of appetite.  Vomiting.  Very high fever. Physical findings of severe malaria may include:  Elevated temperature.  Paleness of the skin (may be a sign of anemia).  Large spleen or liver.  Yellowish color of the skin and usually white part of the eyes  (jaundice).  Severe drowsiness.  Convulsions.  Very low blood pressure. MALARIA RELAPSES Sometimes the symptoms of malaria go away by themselves or can come back after malaria is treated with medicine. This is called a relapse. It occurs because the malaria may lie inactive or sleeping (dormant) in the liver.  DIAGNOSIS  Your caregiver will be able to diagnose malaria by finding parasites on a blood smear examined under a microscope. Other lab work may also be done. PREVENTION  Travelers should take precautions so they do not get malaria when they visit a malaria risk area. Prevention of malaria can aim at:  Preventing infection by avoiding bites by parasite-carrying mosquitoes.  This includes protection measures such as insecticide treated bed nets (ITNs). When used correctly, ITNs prevent mosquito bites and decrease the spread of malaria.  Preventing disease by using antimalarial drugs. The drugs do not prevent initial infection through a mosquito bite, but they prevent the development of malaria parasites in the blood, which are the forms that cause disease. All travelers to areas of the world where malaria occurs should discuss taking antimalarial drugs with their caregiver before leaving to their destination.  Mosquito control. TREATMENT  Medications are available for the treatment of malaria. Document Released: 09/01/2003 Document Revised: 06/03/2013 Document Reviewed: 04/22/2008 [facility] Patient Information 2015 [place], [place]. This information is not intended to replace advice given to you by your health care provider. Make sure you discuss any questions you have with your health care provider.

## 2014-08-13 NOTE — Assessment & Plan Note (Signed)
Travel to [place]. CDC recommends hepatitis A vaccine, as well as Larry prophylaxis. Will treat with medications as above as well as hepatitis A vaccine today. Warned about side effects of malaria medication.

## 2014-08-13 NOTE — Assessment & Plan Note (Signed)
Persistent cough. New issue. Moderate complexity Likely related as reflux or postnasal drip. Trial of Atrovent and omeprazole. Return if not better.

## 2014-08-13 NOTE — Progress Notes (Signed)
Priscilla Sanders is a 59 y.o. female who [facility]  today for discuss blood pressure, cough, and travel plans.  1) blood pressure currently well controlled with the below medications. She notes her blood pressure has been previously well controlled at home and the pharmacy with the most recent blood pressure check 124/84. No chest pains palpitations or shortness of breath.  2) cough. Patient has a one-month history of persistent cough especially worse at bedtime. She feels a postnasal drainage sensation. She denies any burning sensation or sour mouth or other symptoms of acid reflux. No treatment tried yet. No shortness of breath fevers chills nausea vomiting or diarrhea.  3) travel: Patient is leaving in early August for a safari to [place]. She is here today for malaria prophylaxis.  Past Medical History  Diagnosis Date  . Hyperlipidemia   . Hypertension    No past surgical history on file. History  Substance Use Topics  . Smoking status: Never Smoker   . Smokeless tobacco: Not on file  . Alcohol Use: 0.0 oz/week   ROS as above Medications: Current Outpatient Prescriptions  Medication Sig Dispense Refill  . albuterol (PROVENTIL HFA;VENTOLIN HFA) 108 (90 BASE) MCG/ACT inhaler Inhale 2 puffs into the lungs every 6 (six) hours as needed for wheezing. 1 Inhaler 2  . betamethasone dipropionate (DIPROLENE) 0.05 % cream APPLY TO AFFECTED AREA TWICE A DAY 45 g 0  . clobetasol ointment (TEMOVATE) 0.05 % APPLY TO AFFECTED AREA TWICE A DAY 60 g 0  . lisinopril-hydrochlorothiazide (PRINZIDE,ZESTORETIC) 20-12.5 MG per tablet TAKE 1 TABLET BY MOUTH DAILY. 90 tablet 0  . metoprolol succinate (TOPROL-XL) 100 MG 24 hr tablet TAKE 1 TABLET EVERY DAY *TAKE WITH OR IMMEDIATELY FOLLOWING A MEAL* 30 tablet 0  . mometasone (NASONEX) 50 MCG/ACT nasal spray Place 2 sprays into the nose as needed. Two sprays each nostril daily.    . simvastatin (ZOCOR) 20 MG  tablet TAKE 1 TABLET IN THE EVENING 90 tablet 0  . atovaquone-proguanil (MALARONE) 250-100 MG TABS Take 1 tablet by mouth daily. 20 tablet 0  . ipratropium (ATROVENT) 0.06 % nasal spray Place 2 sprays into both nostrils 4 (four) times daily. 15 mL 1  . omeprazole (PRILOSEC) 40 MG capsule Take 1 capsule (40 mg total) by mouth daily. 30 capsule 3   No current facility-administered medications for this visit.   Allergies  Allergen Reactions  . Other     Tree fruits     Exam:  BP 147/93 mmHg  Pulse 75  Wt 241 lb (109.317 kg)  Gen: Well NAD HEENT: EOMI,  MMM posterior pharynx cobblestoning Lungs: Normal work of breathing. CTABL Heart: RRR no MRG Abd: NABS, Soft. Nondistended, Nontender Exts: Brisk capillary refill, warm and well perfused.   No results found for this or any previous visit (from the past 24 hour(s)). No results found.   Please see individual assessment and plan sections. This visit required moderate complexity and decision making.

## 2014-09-15 ENCOUNTER — Other Ambulatory Visit: Payer: Self-pay | Admitting: Physician Assistant

## 2014-10-03 ENCOUNTER — Other Ambulatory Visit: Payer: Self-pay | Admitting: Family Medicine

## 2014-10-05 IMAGING — US US TRANSVAGINAL NON-OB
1 series · 14 of 25 positions shown · non-contrast
Comparison: None.

CLINICAL DATA: Postmenopausal bleeding.

EXAM:
TRANSVAGINAL ULTRASOUND OF PELVIS
TECHNIQUE: Transvaginal ultrasound examination of the pelvis was performed
including evaluation of the uterus, ovaries, adnexal regions, and
pelvic cul-de-sac.

[Series 1: us transvaginal non-ob · 0.39mm/px · 14 of 43 slices shown]
[im 1/43]
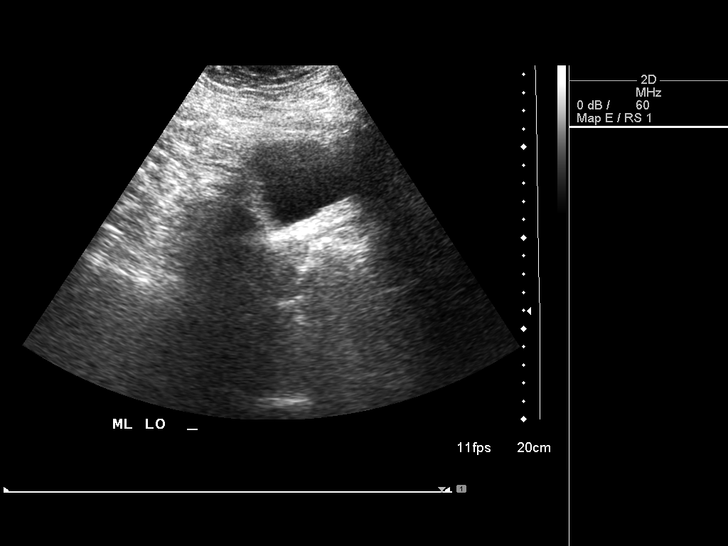
[im 4/43]
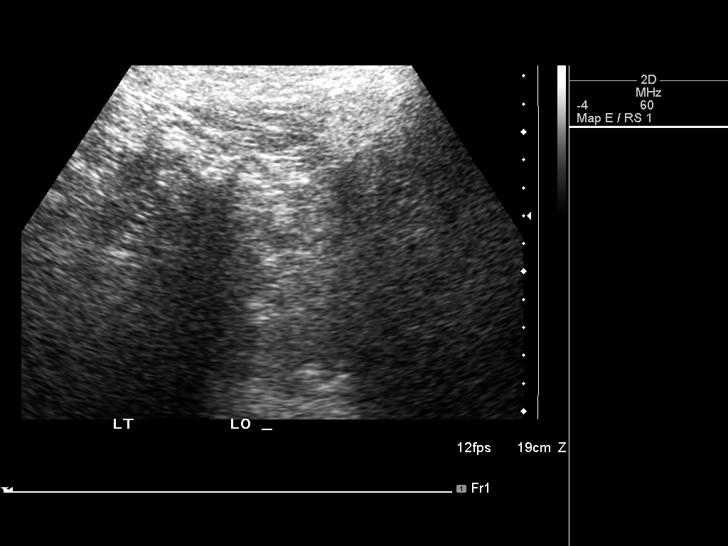
[im 8/43]
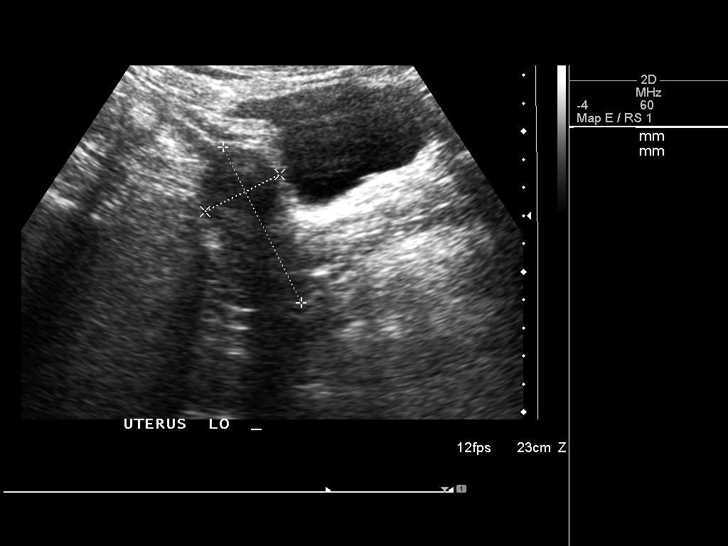
[im 11/43]
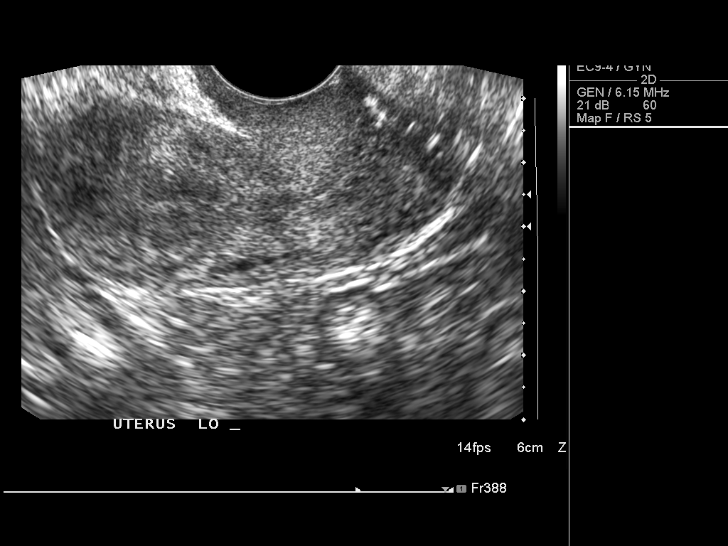
[im 15/43]
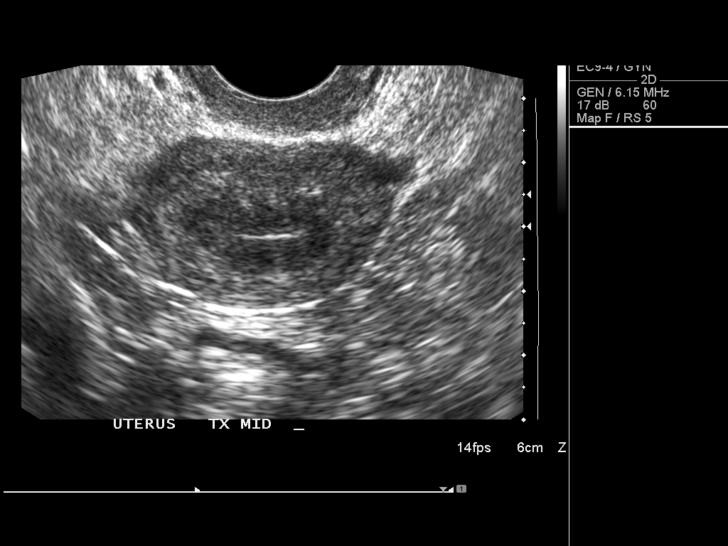
[im 16/43]
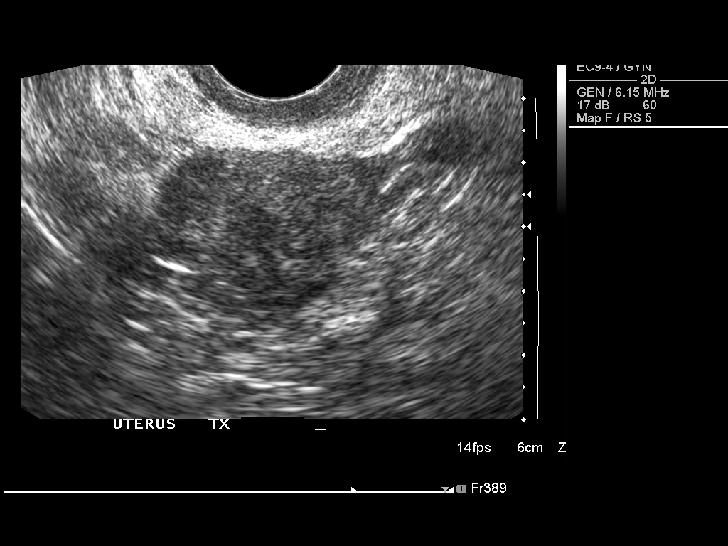
[im 20/43]
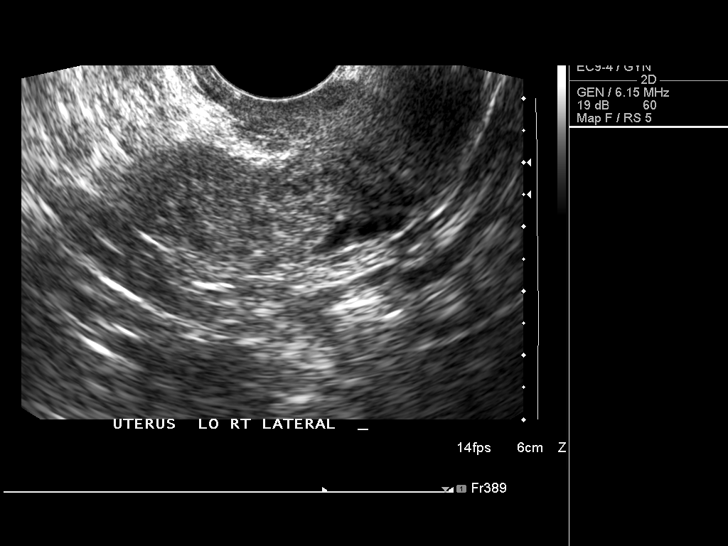
[im 23/43]
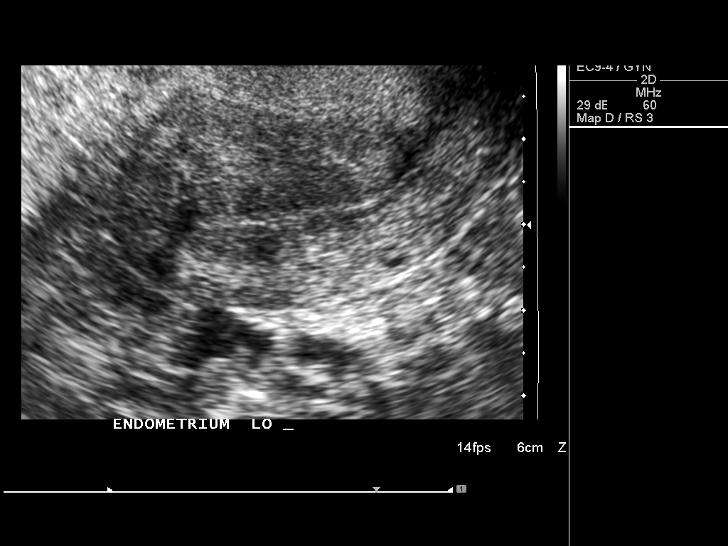
[im 27/43]
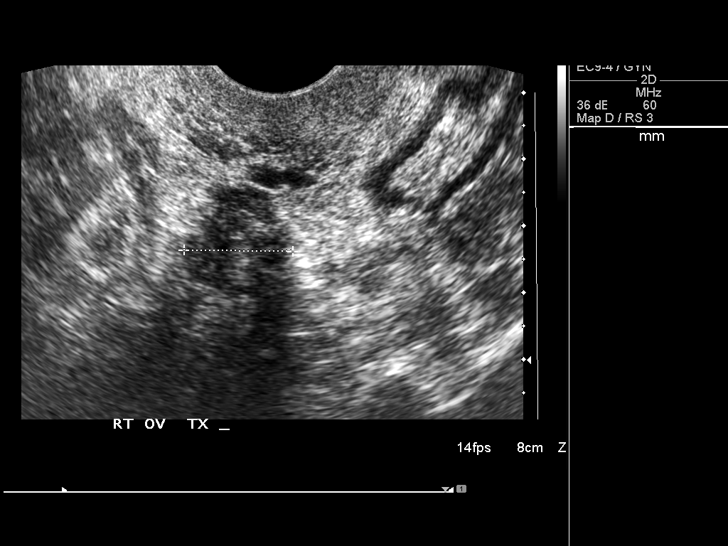
[im 29/43]
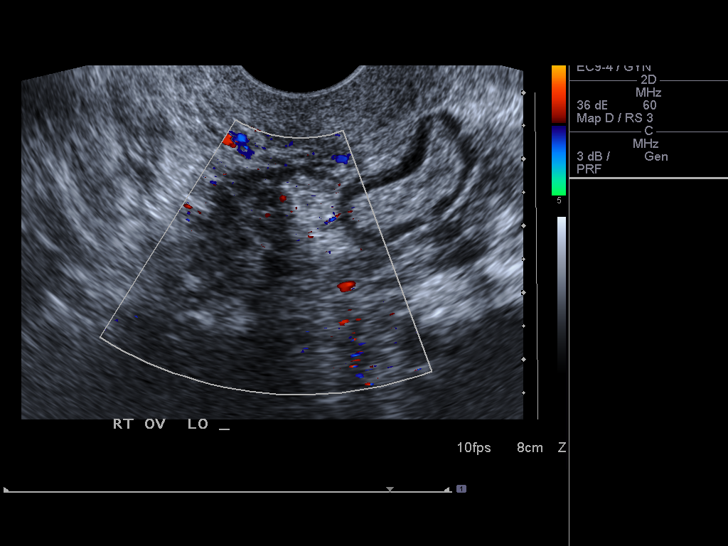
[im 32/43]
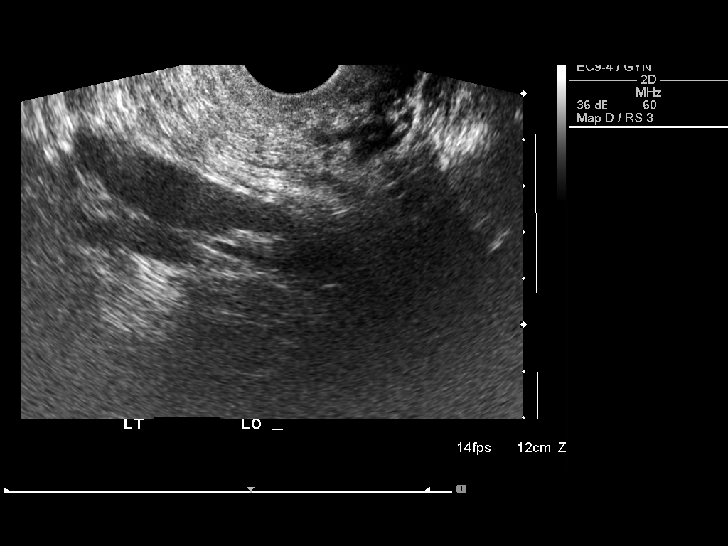
[im 36/43]
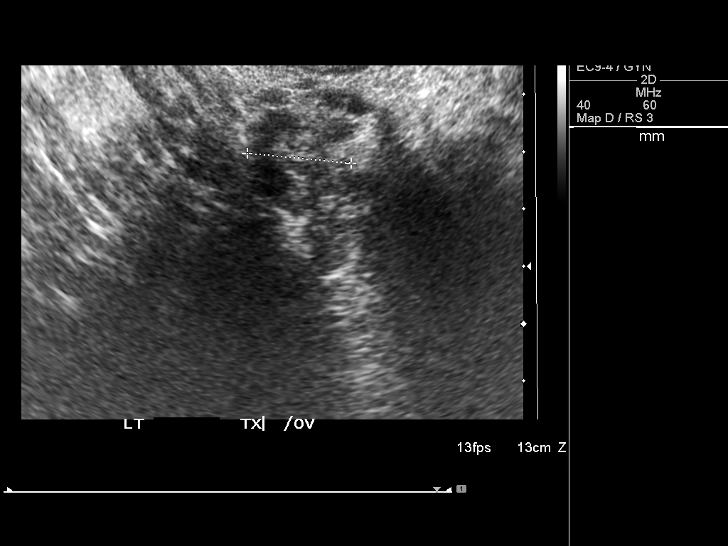
[im 39/43]
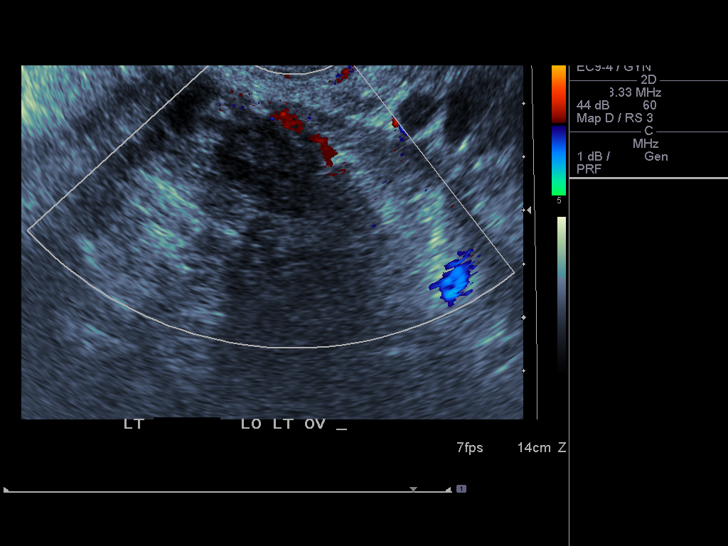
[im 43/43]
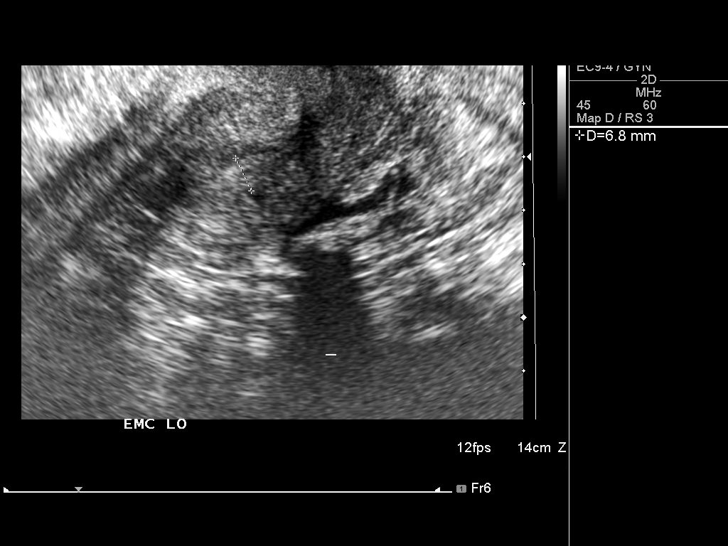

[14 of 25 positions shown; findings below may reference images not displayed]

FINDINGS: Uterus: 6.0 x 2.6 x 4.0 cm. Normal echotexture. No focal
abnormality.

Endometrium: Variable, measuring from 4-7 mm. Endometrium measures 7
mm in the lower uterine segment region. INDICATION appears
homogeneous.

Right ovary: Normal appearance/no adnexal mass

Left ovary: Normal appearance/no adnexal mass

Other findings:  No free fluid
IMPRESSION: Focal thickening of the endometrium in the lower uterine segment.
Consider further evaluation with sonohysterogram or endometrial
biopsy.

## 2014-11-03 ENCOUNTER — Other Ambulatory Visit: Payer: Self-pay | Admitting: Physician Assistant

## 2015-01-31 ENCOUNTER — Other Ambulatory Visit: Payer: Self-pay | Admitting: Physician Assistant

## 2015-02-06 ENCOUNTER — Telehealth: Payer: Self-pay | Admitting: Physician Assistant

## 2015-02-06 NOTE — Telephone Encounter (Signed)
I called pt about scheduling a mammogram appt when she told me that she has not heard back from test results from either September or October.

## 2015-02-09 NOTE — Telephone Encounter (Signed)
I spoke to pt & gave her the results.

## 2015-08-16 ENCOUNTER — Other Ambulatory Visit: Payer: Self-pay | Admitting: Physician Assistant

## 2015-08-17 ENCOUNTER — Other Ambulatory Visit: Payer: Self-pay | Admitting: Physician Assistant

## 2015-11-14 ENCOUNTER — Other Ambulatory Visit: Payer: Self-pay | Admitting: Physician Assistant

## 2015-12-04 IMAGING — US US ABDOMEN COMPLETE
1 series · 14 of 25 positions shown · non-contrast
Comparison: None.

CLINICAL DATA: Right upper quadrant pain with nausea, vomiting and
diarrhea 3 weeks.

EXAM:
ULTRASOUND ABDOMEN COMPLETE

[Series 1: us abdomen complete · 0.27mm/px · 14 of 67 slices shown]
[im 1/67]
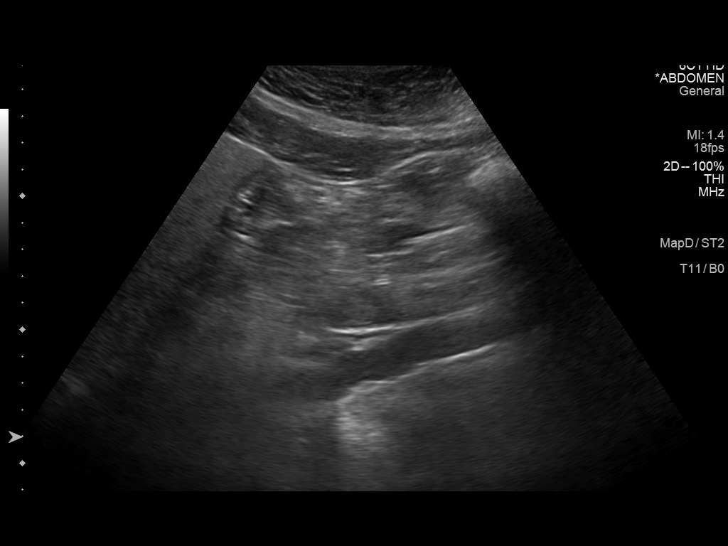
[im 6/67]
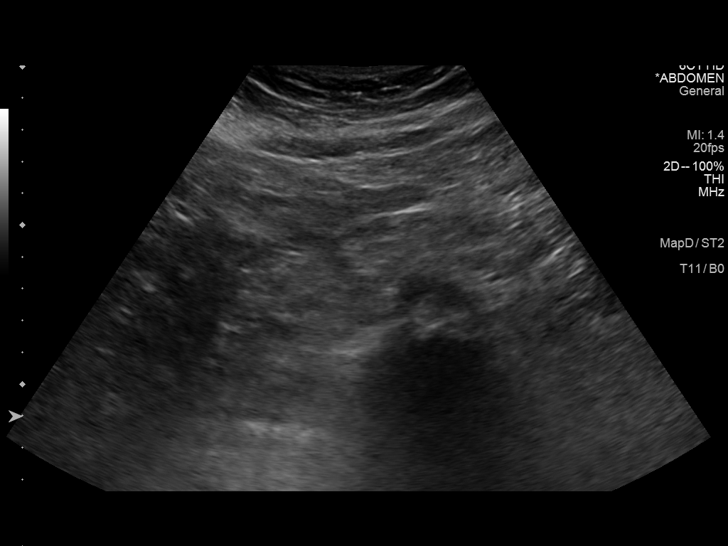
[im 12/67]
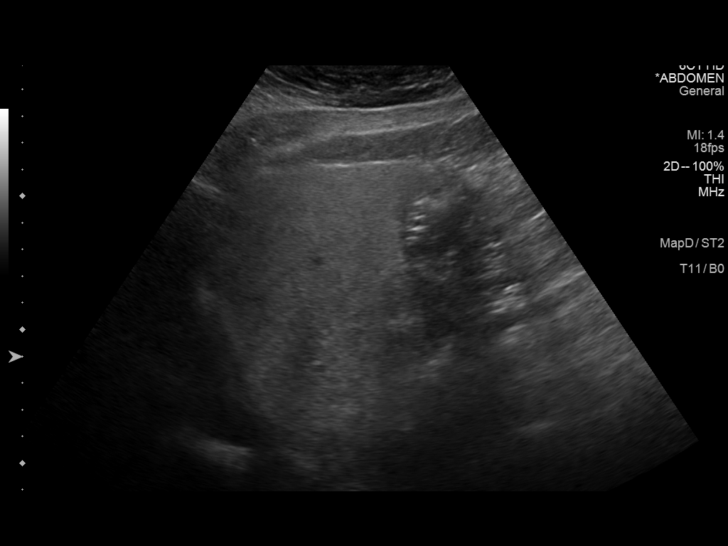
[im 17/67]
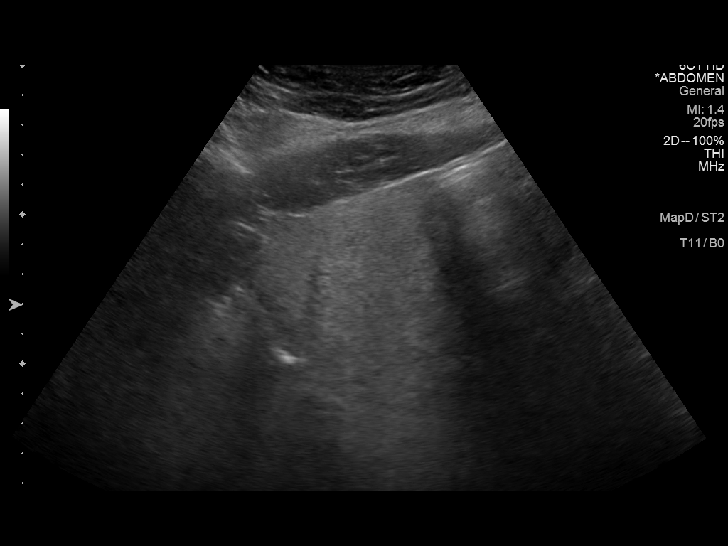
[im 23/67]
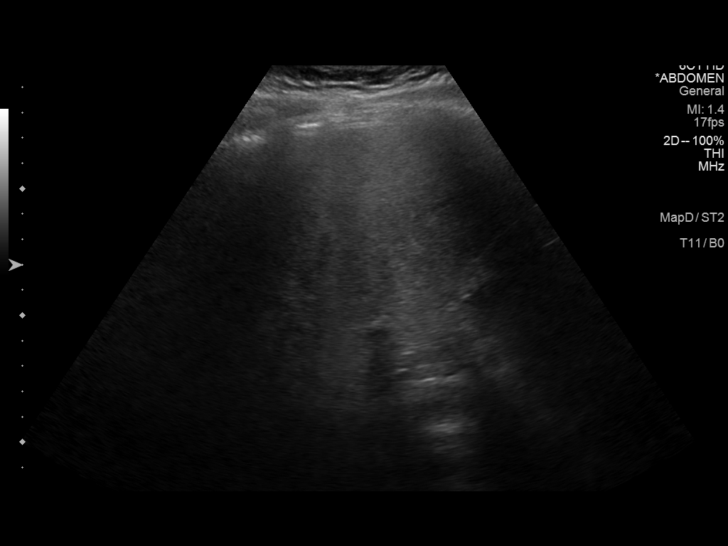
[im 25/67]
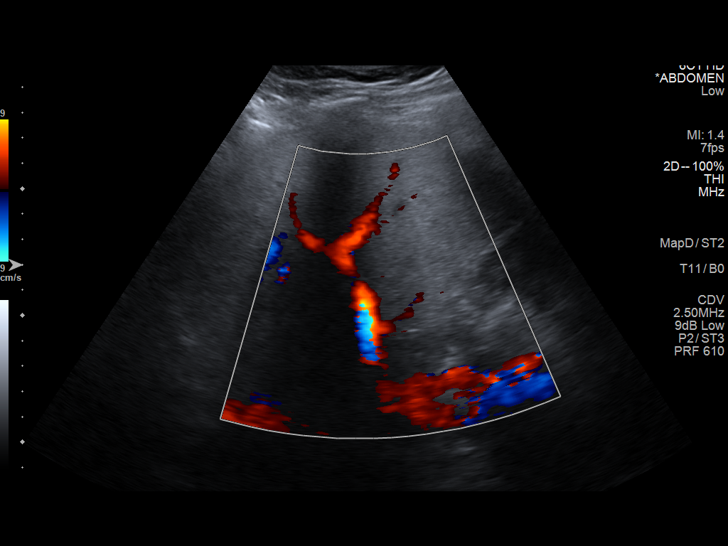
[im 31/67]
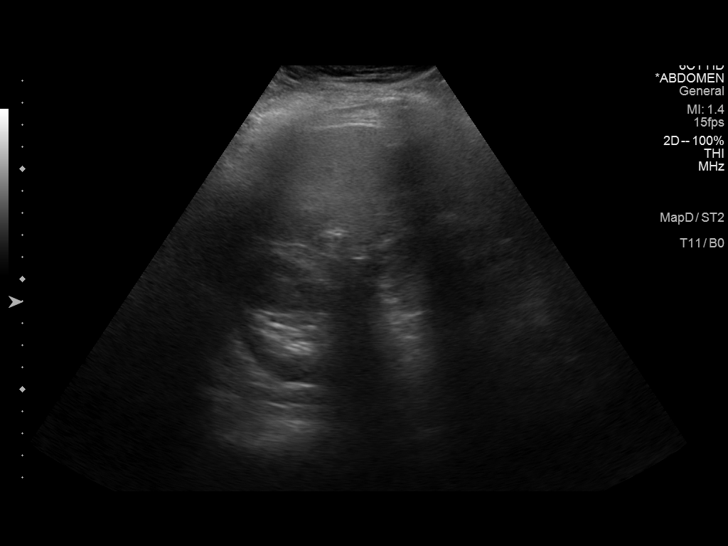
[im 36/67]
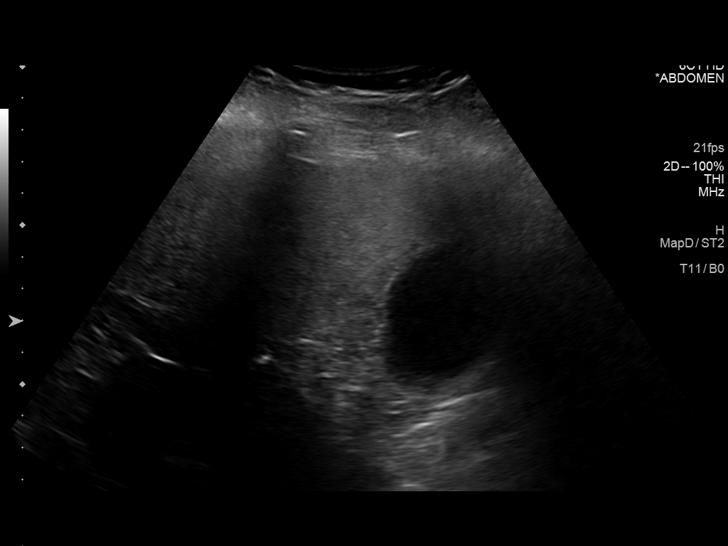
[im 42/67]
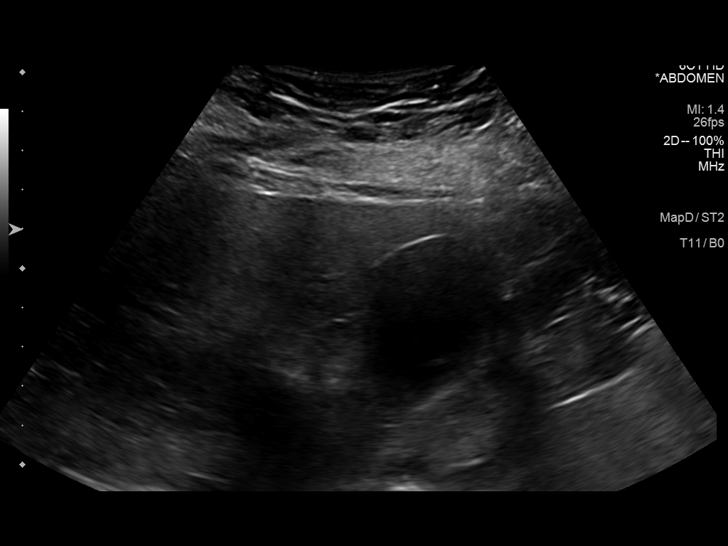
[im 45/67]
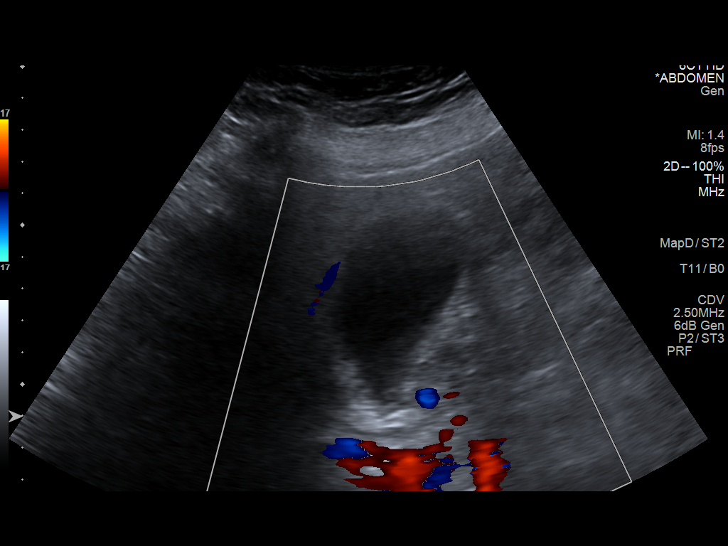
[im 50/67]
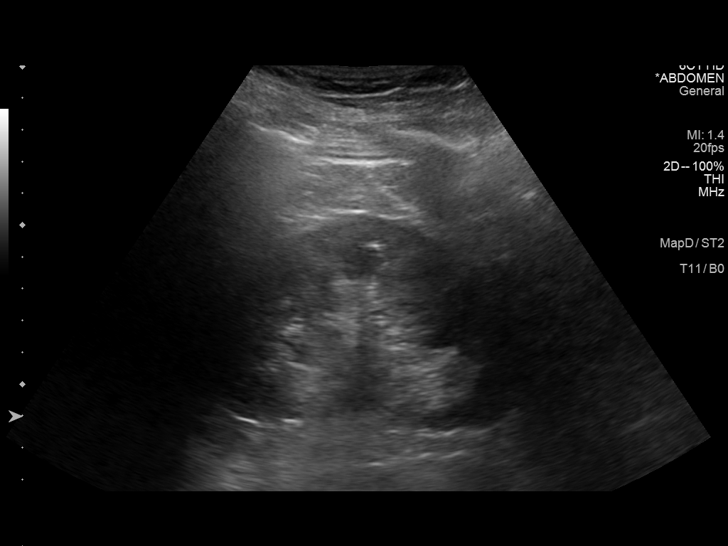
[im 56/67]
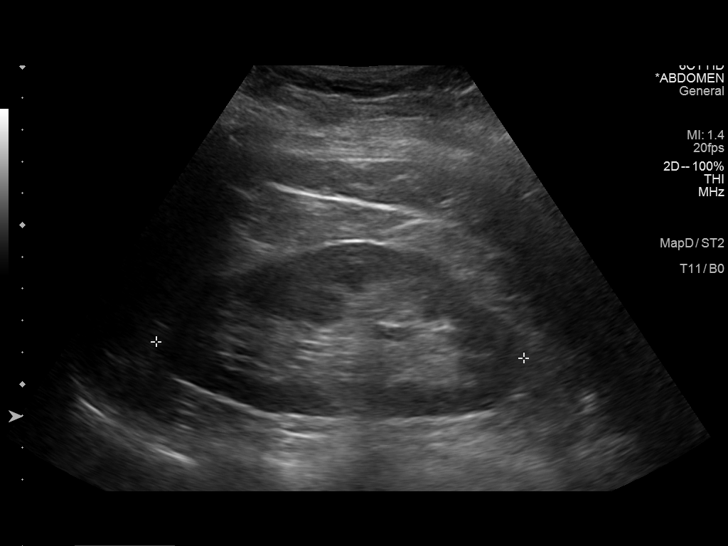
[im 61/67]
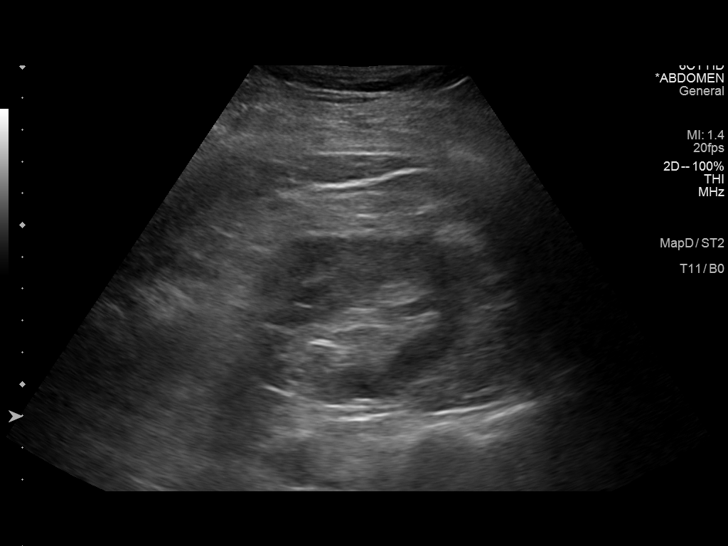
[im 67/67]
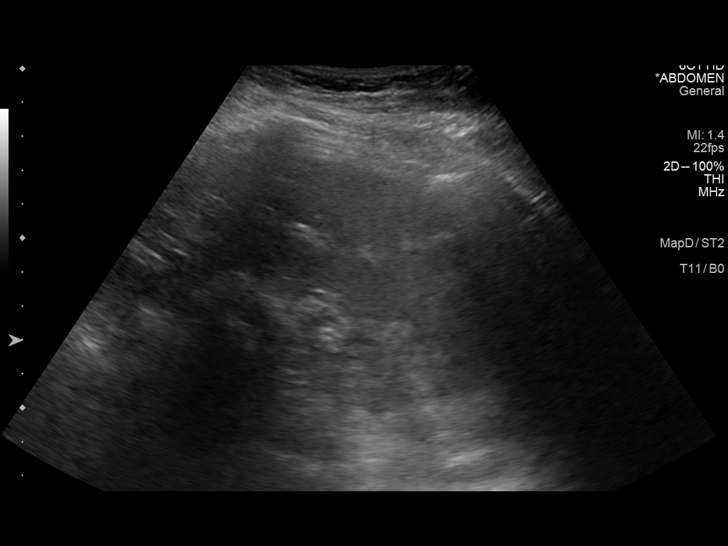

[14 of 25 positions shown; findings below may reference images not displayed]

FINDINGS: Gallbladder: No gallstones or wall thickening visualized. No
sonographic Murphy sign noted.

Common bile duct: Diameter: 2.2 mm.

Liver: Moderate diffuse hepatic steatosis without focal mass.

IVC: No abnormality visualized.

Pancreas: Visualized portion unremarkable.

Spleen: Size and appearance within normal limits.

Right Kidney: Length: 10.7 cm. Echogenicity within normal limits. No
mass or hydronephrosis visualized.

Left Kidney: Length: 11.6 cm. Echogenicity within normal limits. No
mass or hydronephrosis visualized.

Abdominal aorta: No aneurysm visualized.

Other findings: None.
IMPRESSION: No acute hepatobiliary findings.

Moderate diffuse hepatic steatosis without focal mass.

## 2015-12-14 ENCOUNTER — Other Ambulatory Visit: Payer: Self-pay | Admitting: Physician Assistant

## 2015-12-31 ENCOUNTER — Other Ambulatory Visit: Payer: Self-pay | Admitting: Physician Assistant

## 2016-01-01 ENCOUNTER — Other Ambulatory Visit: Payer: Self-pay

## 2016-01-22 ENCOUNTER — Other Ambulatory Visit: Payer: Self-pay

## 2016-01-22 MED ORDER — SIMVASTATIN 20 MG PO TABS: 20.0000 mg | ORAL_TABLET | Freq: Every evening | ORAL | 0 refills | Status: AC

## 2016-01-22 MED ORDER — LISINOPRIL-HYDROCHLOROTHIAZIDE 20-12.5 MG PO TABS: 1.0000 | ORAL_TABLET | Freq: Every day | ORAL | 0 refills | Status: AC

## 2016-01-22 MED ORDER — METOPROLOL SUCCINATE ER 100 MG PO TB24: 100.0000 mg | ORAL_TABLET | Freq: Every day | ORAL | 0 refills | Status: DC

## 2016-02-09 ENCOUNTER — Encounter: Payer: Self-pay | Admitting: Physician Assistant

## 2016-02-09 ENCOUNTER — Ambulatory Visit (INDEPENDENT_AMBULATORY_CARE_PROVIDER_SITE_OTHER): Payer: PRIVATE HEALTH INSURANCE | Admitting: Physician Assistant

## 2016-02-09 VITALS — BP 112/73 | HR 69 | Ht 67.0 in | Wt 235.0 lb

## 2016-02-09 DIAGNOSIS — R05 Cough: Secondary | ICD-10-CM | POA: Diagnosis not present

## 2016-02-09 DIAGNOSIS — Z1211 Encounter for screening for malignant neoplasm of colon: Secondary | ICD-10-CM

## 2016-02-09 DIAGNOSIS — Z131 Encounter for screening for diabetes mellitus: Secondary | ICD-10-CM

## 2016-02-09 DIAGNOSIS — E7800 Pure hypercholesterolemia, unspecified: Secondary | ICD-10-CM

## 2016-02-09 DIAGNOSIS — R059 Cough, unspecified: Secondary | ICD-10-CM

## 2016-02-09 DIAGNOSIS — Z1231 Encounter for screening mammogram for malignant neoplasm of breast: Secondary | ICD-10-CM

## 2016-02-09 DIAGNOSIS — Z1159 Encounter for screening for other viral diseases: Secondary | ICD-10-CM

## 2016-02-09 DIAGNOSIS — I10 Essential (primary) hypertension: Secondary | ICD-10-CM

## 2016-02-09 MED ORDER — METOPROLOL SUCCINATE ER 100 MG PO TB24: 100.0000 mg | ORAL_TABLET | Freq: Every day | ORAL | 1 refills | Status: DC

## 2016-02-09 MED ORDER — BETAMETHASONE DIPROPIONATE 0.05 % EX CREA: TOPICAL_CREAM | CUTANEOUS | 1 refills | Status: AC

## 2016-02-09 MED ORDER — HYDROCHLOROTHIAZIDE 12.5 MG PO TABS: ORAL_TABLET | ORAL | 3 refills | Status: DC

## 2016-02-09 MED ORDER — CLOBETASOL PROPIONATE 0.05 % EX OINT: TOPICAL_OINTMENT | CUTANEOUS | 1 refills | Status: AC

## 2016-02-09 NOTE — Progress Notes (Signed)
   Subjective:    Patient ID: Priscilla Sanders, female    DOB: 08/15/1955, 61 y.o.   MRN: [id number]  HPI  Pt is a 61 yo female who presents to the clinic for follow up. It has been over a year since I saw patient.   HTN- pt is doing well. At times she has felt a little dizzy and felt like BP was too low. Taking medication daily. No CP, palpitations, headaches.   Hyperlipidemia- not checked LDL in a while. Taking zocor 20mg  daily. No side effects.    Review of Systems  All other systems reviewed and are negative.      Objective:   Physical Exam  Constitutional: She is oriented to person, place, and time. She appears well-developed and well-nourished.  HENT:  Head: Normocephalic and atraumatic.  Cardiovascular: Normal rate, regular rhythm and normal heart sounds.   Pulmonary/Chest: Effort normal and breath sounds normal.  Neurological: She is alert and oriented to person, place, and time.  Psychiatric: She has a normal mood and affect. Her behavior is normal.          Assessment & Plan:  Marland Kitchen.Marland Kitchen.Corrie DandyMary was seen today for hypertension and hyperlipidemia.  Diagnoses and all orders for this visit:  Essential hypertension, benign -     metoprolol succinate (TOPROL-XL) 100 MG 24 hr tablet; Take 1 tablet (100 mg total) by mouth daily. -     hydrochlorothiazide (HYDRODIURIL) 12.5 MG tablet; Take 1-2 tablets daily for blood pressure.  Cough  Pure hypercholesterolemia -     Lipid panel  Screening for diabetes mellitus -     COMPLETE METABOLIC PANEL WITH GFR  Encounter for hepatitis C screening test for low risk patient -     Hepatitis C Antibody  Colon cancer screening -     Ambulatory referral to Gastroenterology  Visit for screening mammogram -     MM DIGITAL SCREENING BILATERAL; Future -     MM DIGITAL SCREENING BILATERAL  Other orders -     betamethasone dipropionate (DIPROLENE) 0.05 % cream; APPLY TO AFFECTED AREA TWICE A DAY -     clobetasol ointment (TEMOVATE) 0.05 %; APPLY  TO AFFECTED AREA TWICE A DAY   D/C lisinopril due to low BP.  On HCTZ and metoprolol. Recheck BP in 1 month.

## 2016-02-10 DIAGNOSIS — I10 Essential (primary) hypertension: Secondary | ICD-10-CM | POA: Insufficient documentation

## 2016-02-12 ENCOUNTER — Other Ambulatory Visit: Payer: Self-pay | Admitting: Physician Assistant

## 2016-08-27 ENCOUNTER — Other Ambulatory Visit: Payer: Self-pay | Admitting: Physician Assistant

## 2016-08-27 DIAGNOSIS — I10 Essential (primary) hypertension: Secondary | ICD-10-CM

## 2016-09-19 ENCOUNTER — Other Ambulatory Visit: Payer: Self-pay | Admitting: Physician Assistant

## 2016-09-19 DIAGNOSIS — I10 Essential (primary) hypertension: Secondary | ICD-10-CM

## 2017-02-11 ENCOUNTER — Other Ambulatory Visit: Payer: Self-pay | Admitting: Physician Assistant

## 2017-02-11 DIAGNOSIS — I10 Essential (primary) hypertension: Secondary | ICD-10-CM

## 2017-03-16 ENCOUNTER — Other Ambulatory Visit: Payer: Self-pay | Admitting: Physician Assistant

## 2017-03-16 DIAGNOSIS — I10 Essential (primary) hypertension: Secondary | ICD-10-CM

## 2017-04-11 ENCOUNTER — Other Ambulatory Visit: Payer: Self-pay | Admitting: Physician Assistant

## 2017-04-11 DIAGNOSIS — I10 Essential (primary) hypertension: Secondary | ICD-10-CM

## 2017-04-22 ENCOUNTER — Other Ambulatory Visit: Payer: Self-pay | Admitting: Physician Assistant

## 2017-04-22 DIAGNOSIS — I10 Essential (primary) hypertension: Secondary | ICD-10-CM

## 2017-04-26 ENCOUNTER — Other Ambulatory Visit: Payer: Self-pay | Admitting: Physician Assistant

## 2017-04-26 DIAGNOSIS — I10 Essential (primary) hypertension: Secondary | ICD-10-CM

## 2017-05-12 ENCOUNTER — Other Ambulatory Visit: Payer: Self-pay | Admitting: Physician Assistant

## 2017-05-12 DIAGNOSIS — I10 Essential (primary) hypertension: Secondary | ICD-10-CM

## 2017-05-13 ENCOUNTER — Other Ambulatory Visit: Payer: Self-pay | Admitting: Physician Assistant

## 2017-05-13 DIAGNOSIS — I10 Essential (primary) hypertension: Secondary | ICD-10-CM

## 2017-06-17 ENCOUNTER — Other Ambulatory Visit: Payer: Self-pay | Admitting: Physician Assistant

## 2017-06-17 DIAGNOSIS — I10 Essential (primary) hypertension: Secondary | ICD-10-CM

## 2017-07-09 ENCOUNTER — Other Ambulatory Visit: Payer: Self-pay | Admitting: Physician Assistant

## 2017-07-09 DIAGNOSIS — I10 Essential (primary) hypertension: Secondary | ICD-10-CM
